# Patient Record
Sex: Female | Born: 1990 | Race: Black or African American | Hispanic: No | Marital: Single | Smoking: Current every day smoker
Health system: Southern US, Community
[De-identification: ages and names within clinical notes are randomized; demographics above are authoritative.]

---

## 2014-09-12 ENCOUNTER — Emergency Department (HOSPITAL_COMMUNITY)
Admission: EM | Admit: 2014-09-12 | Discharge: 2014-09-13 | Disposition: A | Discharge status: Home / Self Care | Payer: Self-pay | Attending: Dermatology | Admitting: Dermatology

## 2014-09-12 ENCOUNTER — Emergency Department (HOSPITAL_COMMUNITY): Payer: Self-pay

## 2014-09-12 ENCOUNTER — Encounter (HOSPITAL_COMMUNITY): Payer: Self-pay | Admitting: Emergency Medicine

## 2014-09-12 DIAGNOSIS — R42 Dizziness and giddiness: Secondary | ICD-10-CM | POA: Insufficient documentation

## 2014-09-12 DIAGNOSIS — R55 Syncope and collapse: Secondary | ICD-10-CM | POA: Insufficient documentation

## 2014-09-12 DIAGNOSIS — S3991XA Unspecified injury of abdomen, initial encounter: Secondary | ICD-10-CM | POA: Insufficient documentation

## 2014-09-12 DIAGNOSIS — Y990 Civilian activity done for income or pay: Secondary | ICD-10-CM | POA: Insufficient documentation

## 2014-09-12 DIAGNOSIS — Z720 Tobacco use: Secondary | ICD-10-CM | POA: Insufficient documentation

## 2014-09-12 DIAGNOSIS — R1110 Vomiting, unspecified: Secondary | ICD-10-CM | POA: Insufficient documentation

## 2014-09-12 DIAGNOSIS — Z3202 Encounter for pregnancy test, result negative: Secondary | ICD-10-CM | POA: Insufficient documentation

## 2014-09-12 DIAGNOSIS — R52 Pain, unspecified: Secondary | ICD-10-CM

## 2014-09-12 DIAGNOSIS — Y9389 Activity, other specified: Secondary | ICD-10-CM | POA: Insufficient documentation

## 2014-09-12 DIAGNOSIS — S8992XA Unspecified injury of left lower leg, initial encounter: Secondary | ICD-10-CM | POA: Insufficient documentation

## 2014-09-12 DIAGNOSIS — W1830XA Fall on same level, unspecified, initial encounter: Secondary | ICD-10-CM | POA: Insufficient documentation

## 2014-09-12 DIAGNOSIS — Y9289 Other specified places as the place of occurrence of the external cause: Secondary | ICD-10-CM | POA: Insufficient documentation

## 2014-09-12 DIAGNOSIS — R109 Unspecified abdominal pain: Secondary | ICD-10-CM

## 2014-09-12 DIAGNOSIS — S99922A Unspecified injury of left foot, initial encounter: Secondary | ICD-10-CM | POA: Insufficient documentation

## 2014-09-12 LAB — COMPREHENSIVE METABOLIC PANEL
ALK PHOS: 40 U/L (ref 39–117)
ALT: 9 U/L (ref 0–35)
AST: 16 U/L (ref 0–37)
Albumin: 2.8 g/dL — ABNORMAL LOW (ref 3.5–5.2)
Anion gap: 12 (ref 5–15)
BUN: 11 mg/dL (ref 6–23)
CALCIUM: 8.2 mg/dL — AB (ref 8.4–10.5)
CO2: 23 mEq/L (ref 19–32)
Chloride: 107 mEq/L (ref 96–112)
Creatinine, Ser: 0.91 mg/dL (ref 0.50–1.10)
GFR calc Af Amer: >90 mL/min (ref >90)
GFR, EST NON AFRICAN AMERICAN: 89 mL/min — AB (ref >90)
GLUCOSE: 115 mg/dL — AB (ref 70–99)
Potassium: 3.6 mEq/L — ABNORMAL LOW (ref 3.7–5.3)
SODIUM: 142 meq/L (ref 137–147)
Total Bilirubin: <0.2 mg/dL — ABNORMAL LOW (ref 0.3–1.2)
Total Protein: 5.5 g/dL — ABNORMAL LOW (ref 6.0–8.3)

## 2014-09-12 LAB — URINE MICROSCOPIC-ADD ON

## 2014-09-12 LAB — URINALYSIS, ROUTINE W REFLEX MICROSCOPIC
BILIRUBIN URINE: NEGATIVE
Glucose, UA: NEGATIVE mg/dL
Ketones, ur: NEGATIVE mg/dL
Leukocytes, UA: NEGATIVE
Nitrite: NEGATIVE
PROTEIN: NEGATIVE mg/dL
Specific Gravity, Urine: >1.03 — ABNORMAL HIGH (ref 1.005–1.030)
UROBILINOGEN UA: 0.2 mg/dL (ref 0.0–1.0)
pH: 5.5 (ref 5.0–8.0)

## 2014-09-12 LAB — CBC
HCT: 36 % (ref 36.0–46.0)
Hemoglobin: 12 g/dL (ref 12.0–15.0)
MCH: 28.1 pg (ref 26.0–34.0)
MCHC: 33.3 g/dL (ref 30.0–36.0)
MCV: 84.3 fL (ref 78.0–100.0)
PLATELETS: 210 10*3/uL (ref 150–400)
RBC: 4.27 MIL/uL (ref 3.87–5.11)
RDW: 13.8 % (ref 11.5–15.5)
WBC: 9.1 10*3/uL (ref 4.0–10.5)

## 2014-09-12 LAB — PREGNANCY, URINE: PREG TEST UR: NEGATIVE

## 2014-09-12 LAB — LIPASE, BLOOD: Lipase: 13 U/L (ref 11–59)

## 2014-09-12 MED ORDER — HYDROMORPHONE HCL 1 MG/ML IJ SOLN
1.0000 mg | Freq: Once | INTRAMUSCULAR | Status: AC
  Administered 2014-09-12: 1 mg via INTRAVENOUS
  Filled 2014-09-12: qty 1

## 2014-09-12 MED ORDER — ONDANSETRON HCL 4 MG/2ML IJ SOLN
4.0000 mg | Freq: Once | INTRAMUSCULAR | Status: AC
  Administered 2014-09-12: 4 mg via INTRAVENOUS
  Filled 2014-09-12: qty 2

## 2014-09-12 MED ORDER — SODIUM CHLORIDE 0.9 % IV BOLUS (SEPSIS)
1000.0000 mL | Freq: Once | INTRAVENOUS | Status: AC
  Administered 2014-09-12: 1000 mL via INTRAVENOUS

## 2014-09-12 MED ORDER — HYDROCODONE-ACETAMINOPHEN 5-325 MG PO TABS
2.0000 | ORAL_TABLET | Freq: Once | ORAL | Status: AC
  Administered 2014-09-12: 2 via ORAL
  Filled 2014-09-12: qty 2

## 2014-09-12 MED ORDER — SODIUM CHLORIDE 0.9 % IV BOLUS (SEPSIS)
1000.0000 mL | Freq: Once | INTRAVENOUS | Status: AC
Start: 2014-09-12 — End: 2014-09-13
  Administered 2014-09-12: 1000 mL via INTRAVENOUS

## 2014-09-12 NOTE — ED Provider Notes (Addendum)
CSN: [id number]     Arrival date & time 09/12/14  2034 History  This chart was scribed for Suzi Roots, MD by Tonye Royalty, ED Scribe. This patient was seen in room APA19/[facility]19 and the patient's care was started at 8:55 PM.    Chief Complaint  Patient presents with  . Weakness   The history is provided by the patient. No language interpreter was used.    HPI Comments: Stacey Crawford is a 23 y.o. female who presents to the Emergency Department complaining of syncope tonight. She states she was drawing blood at work at [employer] when she felt mid to upper abd pain, then felt hot and dizzy, then felt generalized weakness, then felt herself going down. She states she lost consciousness and woke on the floor. She notes pain to her left lower leg and great toe sustained on falling; she also complains of associated abdominal pain on waking from syncopal episode with 1 episode vomiting. Emesis not bloody or bilious.  Denies hx similar abd pain. No radiation or back or flank pain. No hx pancreatitis, pud, or gallstones.  No fam hx gallstones. denies any lower abd or pelvic pain. She notes that she donated plasma earlier today, but states she has donated before without problems. Besides donating plasma, she denies bleeding from blood in stool, melena/black stools,  or heavy period. She states she is having regular periods, the last of which was on 11/12. She states she has held her job for some time and states blood does not make her squeamish. Pt indicates is hungry, and had not eaten since lunch time. She denies any new medications. She notes history of migraines. She denies fever, cough, congestion, or appetite change.      History reviewed. No pertinent past medical history. History reviewed. No pertinent past surgical history. History reviewed. No pertinent family history. History  Substance Use Topics  . Smoking status: Current Every Day Smoker    Types: Cigarettes  . Smokeless tobacco:  Not on file  . Alcohol Use: Yes   OB History    No data available     Review of Systems  Constitutional: Negative for fever and chills.  HENT: Negative for congestion and sore throat.   Eyes: Negative for pain and visual disturbance.  Respiratory: Negative for cough and shortness of breath.   Cardiovascular: Negative for chest pain, palpitations and leg swelling.  Gastrointestinal: Positive for vomiting and abdominal pain. Negative for diarrhea, constipation and abdominal distention.  Genitourinary: Negative for dysuria, vaginal bleeding, vaginal discharge and pelvic pain.  Musculoskeletal: Negative for neck pain and neck stiffness.  Skin: Negative for wound.  Neurological: Positive for dizziness, syncope and weakness. Negative for numbness.  Psychiatric/Behavioral: Negative for confusion.  All other systems reviewed and are negative.     Allergies  Review of patient's allergies indicates no known allergies.  Home Medications   Prior to Admission medications   Medication Sig Start Date End Date Taking? Authorizing Provider  ibuprofen (ADVIL,MOTRIN) 200 MG tablet Take 200 mg by mouth every 6 (six) hours as needed for mild pain or moderate pain.   Yes Historical Provider, MD   BP 91/64 mmHg  Pulse 81  Temp(Src) 98.4 F (36.9 C) (Oral)  Resp 15  Ht 5\' 2"  (1.575 m)  Wt 230 lb (104.327 kg)  BMI 42.06 kg/m2  SpO2 99%  LMP 08/14/2014 Physical Exam  Constitutional: She is oriented to person, place, and time. She appears well-developed and well-nourished. No distress.  HENT:  Head: Normocephalic and atraumatic.  No facial or scalp sts or tenderness  Eyes: Conjunctivae and EOM are normal. Pupils are equal, round, and reactive to light. No scleral icterus.  Neck: Normal range of motion. Neck supple. No thyromegaly present.  No stiffness or rigidity. Normal rom.   Cardiovascular: Normal rate, regular rhythm, normal heart sounds and intact distal pulses.  Exam reveals no gallop  and no friction rub.   No murmur heard. Pulmonary/Chest: Effort normal and breath sounds normal. No respiratory distress. She has no wheezes. She has no rales. She exhibits no tenderness.  Abdominal: Soft. Bowel sounds are normal. She exhibits no distension and no mass. There is tenderness (epigastric). There is no rebound and no guarding.  Moderate mid abd/periumbilcal and upper abdomen pain/tenderness, more pronounced on right. No pelvic tenderness.   Genitourinary:  No cva tenderness  Musculoskeletal: Normal range of motion. She exhibits no edema or tenderness.  Tenderness to left mid tibia and left great toe. No deformity or signif sts noted. Good rom and knee and ankle without pain or focal bony tenderness.  CTLS spine, non tender, aligned, no step off.   Neurological: She is alert and oriented to person, place, and time. No cranial nerve deficit.  Motor intact bil. stre 5/5. sens intact.   Skin: Skin is warm and dry. She is not diaphoretic.  Psychiatric: She has a normal mood and affect.  Nursing note and vitals reviewed.   ED Course  Procedures (including critical care time)  DIAGNOSTIC STUDIES: Oxygen Saturation is 99% on room air, normal by my interpretation.    COORDINATION OF CARE: 9:02 PM Discussed treatment plan with patient at beside, the patient agrees with the plan and has no further questions at this time.   Results for orders placed or performed during the hospital encounter of 09/12/14  Comprehensive metabolic panel  Result Value Ref Range   Sodium 142 137 - 147 mEq/L   Potassium 3.6 (L) 3.7 - 5.3 mEq/L   Chloride 107 96 - 112 mEq/L   CO2 23 19 - 32 mEq/L   Glucose, Bld 115 (H) 70 - 99 mg/dL   BUN 11 6 - 23 mg/dL   Creatinine, Ser 0.450.91 0.50 - 1.10 mg/dL   Calcium 8.2 (L) 8.4 - 10.5 mg/dL   Total Protein 5.5 (L) 6.0 - 8.3 g/dL   Albumin 2.8 (L) 3.5 - 5.2 g/dL   AST 16 0 - 37 U/L   ALT 9 0 - 35 U/L   Alkaline Phosphatase 40 39 - 117 U/L   Total Bilirubin  <0.2 (L) 0.3 - 1.2 mg/dL   GFR calc non Af Amer 89 (L) >90 mL/min   GFR calc Af Amer >90 >90 mL/min   Anion gap 12 5 - 15  CBC  Result Value Ref Range   WBC 9.1 4.0 - 10.5 K/uL   RBC 4.27 3.87 - 5.11 MIL/uL   Hemoglobin 12.0 12.0 - 15.0 g/dL   HCT 40.936.0 81.136.0 - 91.446.0 %   MCV 84.3 78.0 - 100.0 fL   MCH 28.1 26.0 - 34.0 pg   MCHC 33.3 30.0 - 36.0 g/dL   RDW 78.213.8 95.611.5 - 21.315.5 %   Platelets 210 150 - 400 K/uL  Pregnancy, urine  Result Value Ref Range   Preg Test, Ur NEGATIVE NEGATIVE  Lipase, blood  Result Value Ref Range   Lipase 13 11 - 59 U/L  Urinalysis, Routine w reflex microscopic  Result Value Ref Range  Color, Urine YELLOW YELLOW   APPearance HAZY (A) CLEAR   Specific Gravity, Urine >1.030 (H) 1.005 - 1.030   pH 5.5 5.0 - 8.0   Glucose, UA NEGATIVE NEGATIVE mg/dL   Hgb urine dipstick LARGE (A) NEGATIVE   Bilirubin Urine NEGATIVE NEGATIVE   Ketones, ur NEGATIVE NEGATIVE mg/dL   Protein, ur NEGATIVE NEGATIVE mg/dL   Urobilinogen, UA 0.2 0.0 - 1.0 mg/dL   Nitrite NEGATIVE NEGATIVE   Leukocytes, UA NEGATIVE NEGATIVE  Urine microscopic-add on  Result Value Ref Range   Squamous Epithelial / LPF MANY (A) RARE   RBC / HPF 11-20 <3 RBC/hpf   Bacteria, UA FEW (A) RARE   Dg Tibia/fibula Left  09/12/2014   CLINICAL DATA:  Syncope at work, loss of consciousness, woke up on floor, leg and foot pain.  EXAM: LEFT TIBIA AND FIBULA - 2 VIEW  COMPARISON:  None.  FINDINGS: There is no evidence of fracture or other focal bone lesions. Soft tissues are unremarkable.  IMPRESSION: Negative.   Electronically Signed   By: Awilda Metroourtnay  Bloomer   On: 09/12/2014 21:26   Dg Toe Great Left  09/12/2014   CLINICAL DATA:  Syncope at work, loss of consciousness, woke up on floor, leg and foot pain.  EXAM: LEFT GREAT TOE  COMPARISON:  None.  FINDINGS: There is no evidence of fracture or dislocation. There is no evidence of arthropathy or other focal bone abnormality. Soft tissues are unremarkable.   IMPRESSION: Negative.   Electronically Signed   By: Awilda Metroourtnay  Bloomer   On: 09/12/2014 21:27       MDM   I personally performed the services described in this documentation, which was scribed in my presence. The recorded information has been reviewed and considered. Suzi RootsKevin E Osie Amparo, MD  Xrays. Iv ns bolus. Dilaudid 1 mg iv for pain. zofran iv.   Labs.  Reviewed nursing notes and prior charts for additional history.   Additional ivf.   Recheck spine nt.  Pt states wants food/drink, c/o not eating all evening.   On recheck, persistent mod abd tenderness, mid abd and right side, without peritoneal signs.   Recheck additional occasions, discussed labs.    As abd pain/tenderness persist, will get ct.   Signed out to Dr Preston FleetingGlick to check ct when back.  If ct neg and pt improved, feel will likely be stable for d/c.           Suzi RootsKevin E Amauris Debois, MD 09/13/14 [phone]

## 2014-09-12 NOTE — ED Notes (Signed)
Pt was at work and getting blood sample bent over having abd pain and fell to the floor per staff. Pt states she gave plasma today at 1530

## 2014-09-13 ENCOUNTER — Emergency Department (HOSPITAL_COMMUNITY): Payer: Self-pay

## 2014-09-13 MED ORDER — IOHEXOL 300 MG/ML  SOLN
100.0000 mL | Freq: Once | INTRAMUSCULAR | Status: AC | PRN
  Administered 2014-09-13: 100 mL via INTRAVENOUS

## 2014-09-13 MED ORDER — IOHEXOL 300 MG/ML  SOLN
50.0000 mL | Freq: Once | INTRAMUSCULAR | Status: AC | PRN
  Administered 2014-09-13: 50 mL via ORAL

## 2014-09-13 MED ORDER — OXYCODONE-ACETAMINOPHEN 5-325 MG PO TABS: 1.0000 | ORAL_TABLET | ORAL | Status: AC | PRN

## 2014-09-13 NOTE — Discharge Instructions (Signed)
Abdominal Pain °Many things can cause abdominal pain. Usually, abdominal pain is not caused by a disease and will improve without treatment. It can often be observed and treated at home. Your health care provider will do a physical exam and possibly order blood tests and X-rays to help determine the seriousness of your pain. However, in many cases, more time must pass before a clear cause of the pain can be found. Before that point, your health care provider may not know if you need more testing or further treatment. °HOME CARE INSTRUCTIONS  °Monitor your abdominal pain for any changes. The following actions may help to alleviate any discomfort you are experiencing: °· Only take over-the-counter or prescription medicines as directed by your health care provider. °· Do not take laxatives unless directed to do so by your health care provider. °· Try a clear liquid diet (broth, tea, or water) as directed by your health care provider. Slowly move to a bland diet as tolerated. °SEEK MEDICAL CARE IF: °· You have unexplained abdominal pain. °· You have abdominal pain associated with nausea or diarrhea. °· You have pain when you urinate or have a bowel movement. °· You experience abdominal pain that wakes you in the night. °· You have abdominal pain that is worsened or improved by eating food. °· You have abdominal pain that is worsened with eating fatty foods. °· You have a fever. °SEEK IMMEDIATE MEDICAL CARE IF:  °· Your pain does not go away within 2 hours. °· You keep throwing up (vomiting). °· Your pain is felt only in portions of the abdomen, such as the right side or the left lower portion of the abdomen. °· You pass bloody or black tarry stools. °MAKE SURE YOU: °· Understand these instructions.   °· Will watch your condition.   °· Will get help right away if you are not doing well or get worse.   °Document Released: 06/29/2005 Document Revised: 09/24/2013 Document Reviewed: 05/29/2013 °ExitCare® Patient Information  ©2015 [employer]. This information is not intended to replace advice given to you by your health care provider. Make sure you discuss any questions you have with your health care provider. ° °Syncope °Syncope is a medical term for fainting or passing out. This means you lose consciousness and drop to the ground. People are generally unconscious for less than 5 minutes. You may have some muscle twitches for up to 15 seconds before waking up and returning to normal. Syncope occurs more often in older adults, but it can happen to anyone. While most causes of syncope are not dangerous, syncope can be a sign of a serious medical problem. It is important to seek medical care.  °CAUSES  °Syncope is caused by a sudden drop in blood flow to the brain. The specific cause is often not determined. Factors that can bring on syncope include: °· Taking medicines that lower blood pressure. °· Sudden changes in posture, such as standing up quickly. °· Taking more medicine than prescribed. °· Standing in one place for too long. °· Seizure disorders. °· Dehydration and excessive exposure to heat. °· Low blood sugar (hypoglycemia). °· Straining to have a bowel movement. °· Heart disease, irregular heartbeat, or other circulatory problems. °· Fear, emotional distress, seeing blood, or severe pain. °SYMPTOMS  °Right before fainting, you may: °· Feel dizzy or light-headed. °· Feel nauseous. °· See all white or all black in your field of vision. °· Have cold, clammy skin. °DIAGNOSIS  °Your health care provider will ask about your   symptoms, perform a physical exam, and perform an electrocardiogram (ECG) to record the electrical activity of your heart. Your health care provider may also perform other heart or blood tests to determine the cause of your syncope which may include:  Transthoracic echocardiogram (TTE). During echocardiography, sound waves are used to evaluate how blood flows through your heart.  Transesophageal  echocardiogram (TEE).  Cardiac monitoring. This allows your health care provider to monitor your heart rate and rhythm in real time.  Holter monitor. This is a portable device that records your heartbeat and can help diagnose heart arrhythmias. It allows your health care provider to track your heart activity for several days, if needed.  Stress tests by exercise or by giving medicine that makes the heart beat faster. TREATMENT  In most cases, no treatment is needed. Depending on the cause of your syncope, your health care provider may recommend changing or stopping some of your medicines. HOME CARE INSTRUCTIONS  Have someone stay with you until you feel stable.  Do not drive, use machinery, or play sports until your health care provider says it is okay.  Keep all follow-up appointments as directed by your health care provider.  Lie down right away if you start feeling like you might faint. Breathe deeply and steadily. Wait until all the symptoms have passed.  Drink enough fluids to keep your urine clear or pale yellow.  If you are taking blood pressure or heart medicine, get up slowly and take several minutes to sit and then stand. This can reduce dizziness. SEEK IMMEDIATE MEDICAL CARE IF:   You have a severe headache.  You have unusual pain in the chest, abdomen, or back.  You are bleeding from your mouth or rectum, or you have black or tarry stool.  You have an irregular or very fast heartbeat.  You have pain with breathing.  You have repeated fainting or seizure-like jerking during an episode.  You faint when sitting or lying down.  You have confusion.  You have trouble walking.  You have severe weakness.  You have vision problems. If you fainted, call your local emergency services (911 in [place] Do not drive yourself to the hospital.  MAKE SURE YOU:  Understand these instructions.  Will watch your condition.  Will get help right away if you are not doing well  or get worse. Document Released: 09/19/2005 Document Revised: 09/24/2013 Document Reviewed: 11/18/2011 [facility] Patient Information 2015 [employer], [place]. This information is not intended to replace advice given to you by your health care provider. Make sure you discuss any questions you have with your health care provider.  Acetaminophen; Oxycodone tablets What is this medicine? ACETAMINOPHEN; OXYCODONE (a set a MEE noe fen; ox i KOE done) is a pain reliever. It is used to treat mild to moderate pain. This medicine may be used for other purposes; ask your health care provider or pharmacist if you have questions. COMMON BRAND NAME(S): Endocet, Magnacet, Narvox, Percocet, Perloxx, Primalev, Primlev, Roxicet, Xolox What should I tell my health care provider before I take this medicine? They need to know if you have any of these conditions: -brain tumor -Crohn's disease, inflammatory bowel disease, or ulcerative colitis -drug abuse or addiction -head injury -heart or circulation problems -if you often drink alcohol -kidney disease or problems going to the bathroom -liver disease -lung disease, asthma, or breathing problems -an unusual or allergic reaction to acetaminophen, oxycodone, other opioid analgesics, other medicines, foods, dyes, or preservatives -pregnant or trying to get pregnant -  breast-feeding How should I use this medicine? Take this medicine by mouth with a full glass of water. Follow the directions on the prescription label. Take your medicine at regular intervals. Do not take your medicine more often than directed. Talk to your pediatrician regarding the use of this medicine in children. Special care may be needed. Patients over 80 years old may have a stronger reaction and need a smaller dose. Overdosage: If you think you have taken too much of this medicine contact a poison control center or emergency room at once. NOTE: This medicine is only for you. Do not share this  medicine with others. What if I miss a dose? If you miss a dose, take it as soon as you can. If it is almost time for your next dose, take only that dose. Do not take double or extra doses. What may interact with this medicine? -alcohol -antihistamines -barbiturates like amobarbital, butalbital, butabarbital, methohexital, pentobarbital, phenobarbital, thiopental, and secobarbital -benztropine -drugs for bladder problems like solifenacin, trospium, oxybutynin, tolterodine, hyoscyamine, and methscopolamine -drugs for breathing problems like ipratropium and tiotropium -drugs for certain stomach or intestine problems like propantheline, homatropine methylbromide, glycopyrrolate, atropine, belladonna, and dicyclomine -general anesthetics like etomidate, ketamine, nitrous oxide, propofol, desflurane, enflurane, halothane, isoflurane, and sevoflurane -medicines for depression, anxiety, or psychotic disturbances -medicines for sleep -muscle relaxants -naltrexone -narcotic medicines (opiates) for pain -phenothiazines like perphenazine, thioridazine, chlorpromazine, mesoridazine, fluphenazine, prochlorperazine, promazine, and trifluoperazine -scopolamine -tramadol -trihexyphenidyl This list may not describe all possible interactions. Give your health care provider a list of all the medicines, herbs, non-prescription drugs, or dietary supplements you use. Also tell them if you smoke, drink alcohol, or use illegal drugs. Some items may interact with your medicine. What should I watch for while using this medicine? Tell your doctor or health care professional if your pain does not go away, if it gets worse, or if you have new or a different type of pain. You may develop tolerance to the medicine. Tolerance means that you will need a higher dose of the medication for pain relief. Tolerance is normal and is expected if you take this medicine for a long time. Do not suddenly stop taking your medicine  because you may develop a severe reaction. Your body becomes used to the medicine. This does NOT mean you are addicted. Addiction is a behavior related to getting and using a drug for a non-medical reason. If you have pain, you have a medical reason to take pain medicine. Your doctor will tell you how much medicine to take. If your doctor wants you to stop the medicine, the dose will be slowly lowered over time to avoid any side effects. You may get drowsy or dizzy. Do not drive, use machinery, or do anything that needs mental alertness until you know how this medicine affects you. Do not stand or sit up quickly, especially if you are an older patient. This reduces the risk of dizzy or fainting spells. Alcohol may interfere with the effect of this medicine. Avoid alcoholic drinks. There are different types of narcotic medicines (opiates) for pain. If you take more than one type at the same time, you may have more side effects. Give your health care provider a list of all medicines you use. Your doctor will tell you how much medicine to take. Do not take more medicine than directed. Call emergency for help if you have problems breathing. The medicine will cause constipation. Try to have a bowel movement at least every 2  to 3 days. If you do not have a bowel movement for 3 days, call your doctor or health care professional. Do not take Tylenol (acetaminophen) or medicines that have acetaminophen with this medicine. Too much acetaminophen can be very dangerous. Many nonprescription medicines contain acetaminophen. Always read the labels carefully to avoid taking more acetaminophen. What side effects may I notice from receiving this medicine? Side effects that you should report to your doctor or health care professional as soon as possible: -allergic reactions like skin rash, itching or hives, swelling of the face, lips, or tongue -breathing difficulties, wheezing -confusion -light headedness or fainting  spells -severe stomach pain -unusually weak or tired -yellowing of the skin or the whites of the eyes Side effects that usually do not require medical attention (report to your doctor or health care professional if they continue or are bothersome): -dizziness -drowsiness -nausea -vomiting This list may not describe all possible side effects. Call your doctor for medical advice about side effects. You may report side effects to FDA at 1-800-FDA-1088. Where should I keep my medicine? Keep out of the reach of children. This medicine can be abused. Keep your medicine in a safe place to protect it from theft. Do not share this medicine with anyone. Selling or giving away this medicine is dangerous and against the law. Store at room temperature between 20 and 25 degrees C (68 and 77 degrees F). Keep container tightly closed. Protect from light. This medicine may cause accidental overdose and death if it is taken by other adults, children, or pets. Flush any unused medicine down the toilet to reduce the chance of harm. Do not use the medicine after the expiration date. NOTE: This sheet is a summary. It may not cover all possible information. If you have questions about this medicine, talk to your doctor, pharmacist, or health care provider.  2015, [employer]. (2013-05-13 13:17:35)

## 2014-09-13 NOTE — ED Notes (Signed)
Pt was given a pre-package of Percocet 5/325 mg quantity six and given instructions on use, pt verbally understands.

## 2014-09-13 NOTE — ED Provider Notes (Signed)
Patient initially seen and evaluated by Dr. Meredith ModyStein all, signed out to me to evaluate results of CT scan. CT is come back unremarkable. Patient is feeling much better but, on exam, she continues to have periumbilical tenderness. She is discharged with prescription for oxycodone and acetaminophen. She is given a work release for 2 days.  Dione Boozeavid Pietrina Jagodzinski, MD 09/13/14 [phone]

## 2014-09-29 MED FILL — Oxycodone w/ Acetaminophen Tab 5-325 MG: ORAL | Qty: 6 | Status: AC

## 2016-04-26 IMAGING — CT CT ABD-PELV W/ CM
2 of 4 series · 17 of 46 positions shown, 19 images · IV contrast (Omnipaque 300)
Comparison: None.

CLINICAL DATA: Patient passed out wall drawing blood work. Mid to
upper abdominal pain. Hot and dizzy with generalized weakness.

EXAM:
CT ABDOMEN AND PELVIS WITH CONTRAST
TECHNIQUE: Multidetector CT imaging of the abdomen and pelvis was performed
using the standard protocol following bolus administration of
intravenous contrast.
CONTRAST:  50mL OMNIPAQUE IOHEXOL 300 MG/ML SOLN, 100mL OMNIPAQUE
IOHEXOL 300 MG/ML SOLN

[Series 2: abd_pel_with 5.0 b40f · axial · 0.78mm/px · z∈[+646,+1060]mm · 14 of 91 slices shown, 16 images]
[im 4/91  soft-tissue]
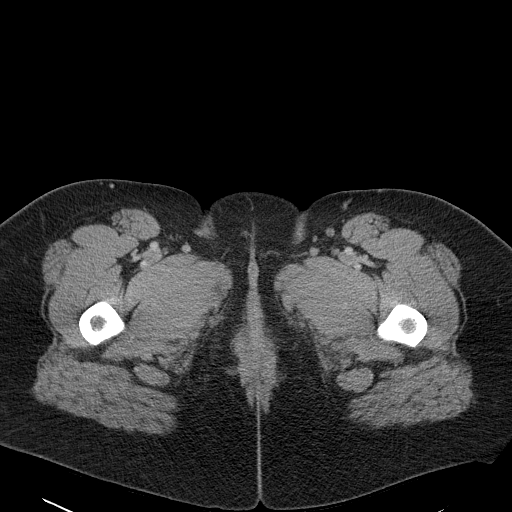
[im 4/91  bone]
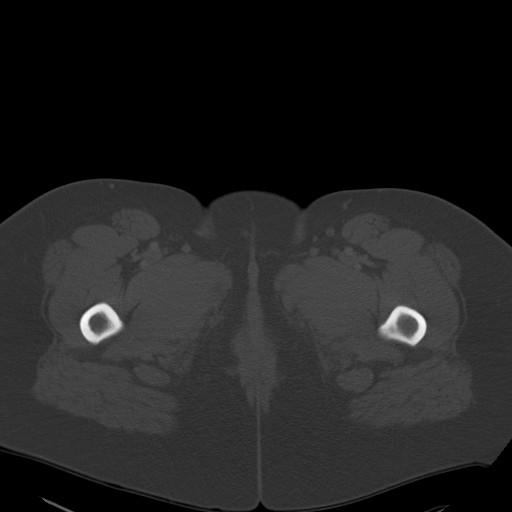
[im 12/91  soft-tissue]
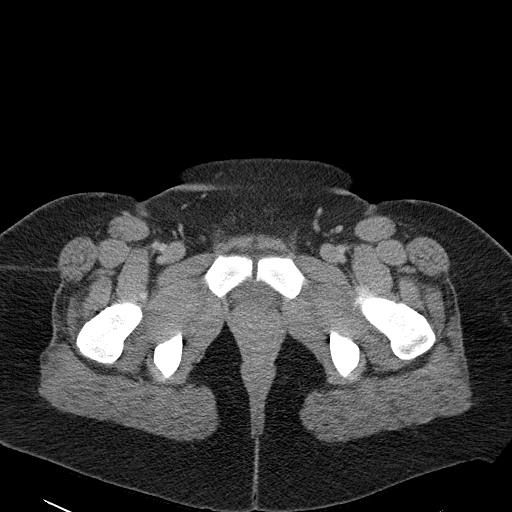
[im 19/91  soft-tissue]
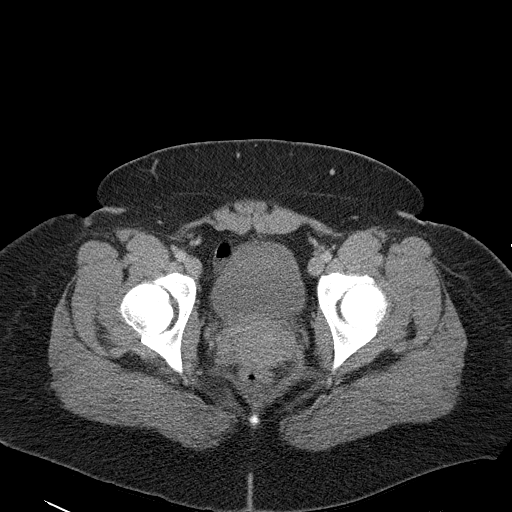
[im 23/91  soft-tissue]
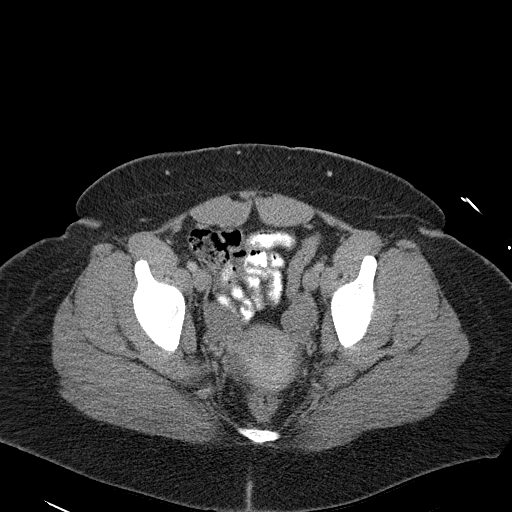
[im 31/91  soft-tissue]
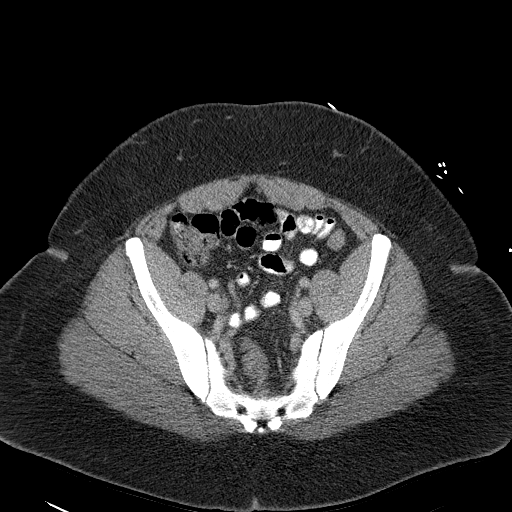
[im 38/91  soft-tissue]
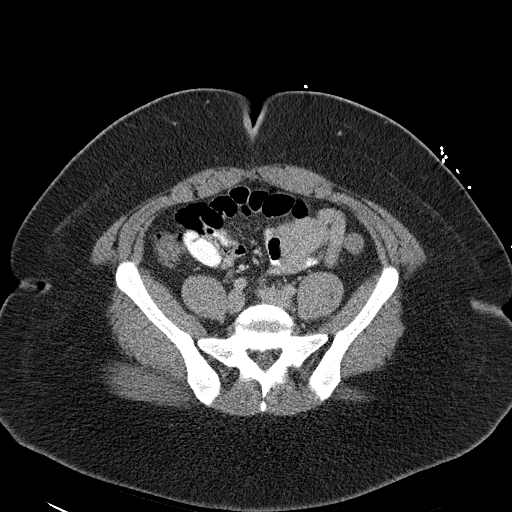
[im 42/91  soft-tissue]
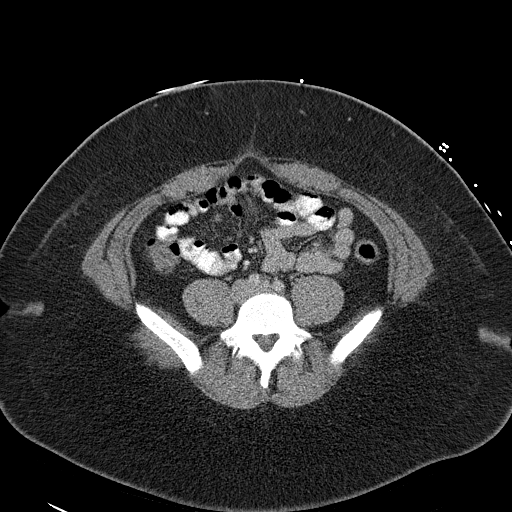
[im 49/91  soft-tissue]
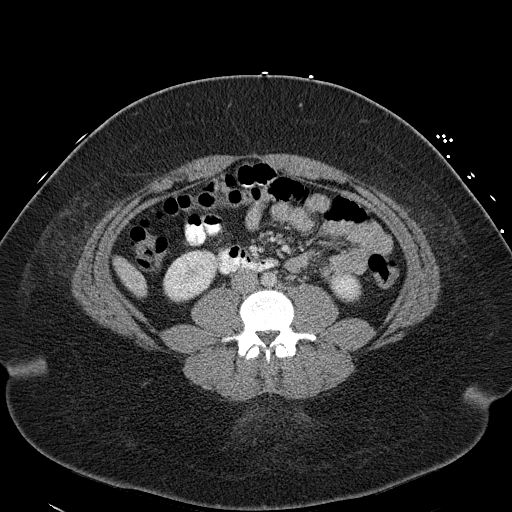
[im 53/91  soft-tissue]
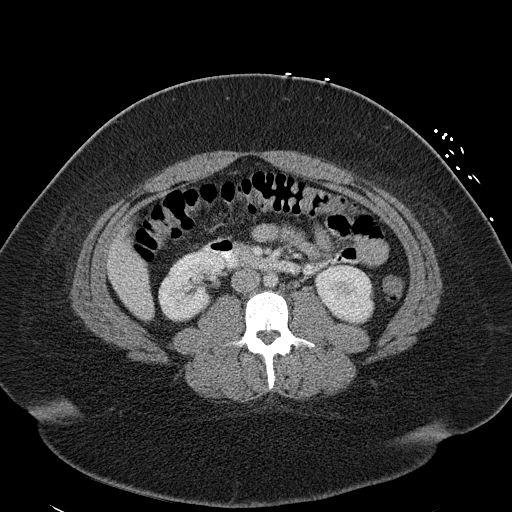
[im 53/91  bone]
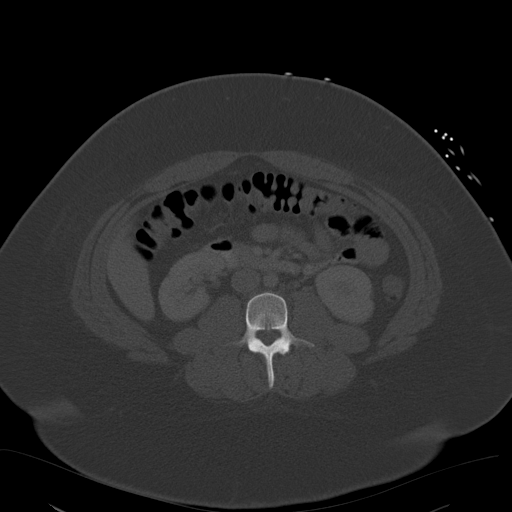
[im 61/91  soft-tissue]
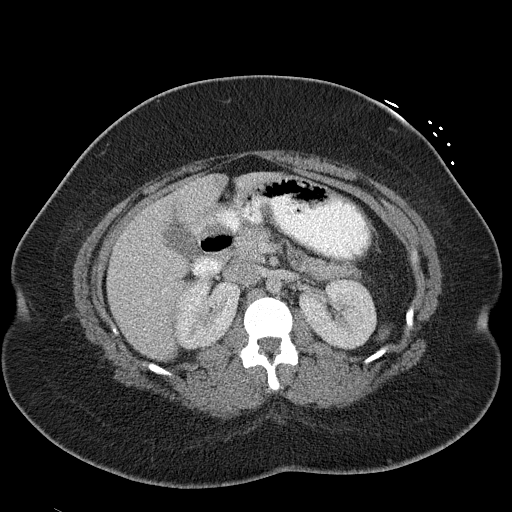
[im 68/91  soft-tissue]
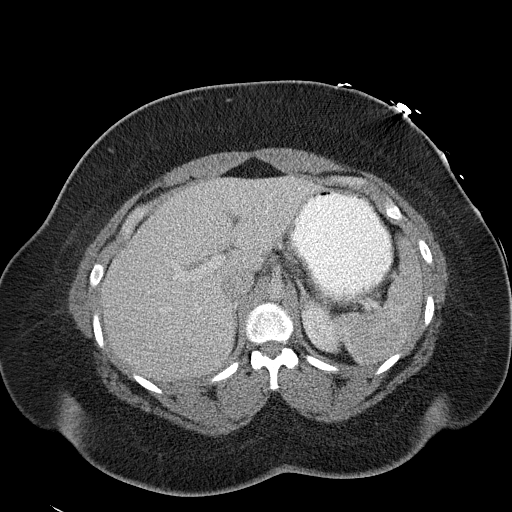
[im 72/91  soft-tissue]
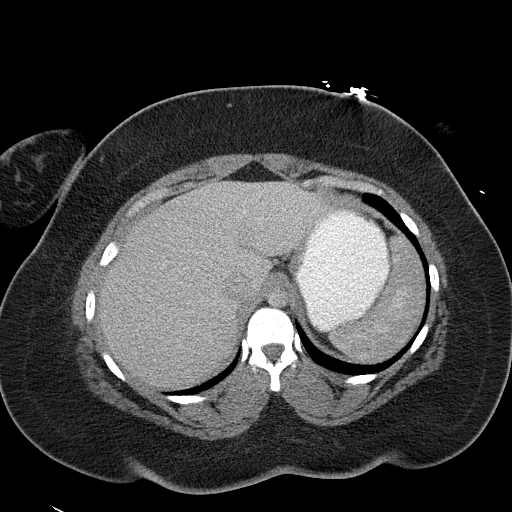
[im 79/91  soft-tissue]
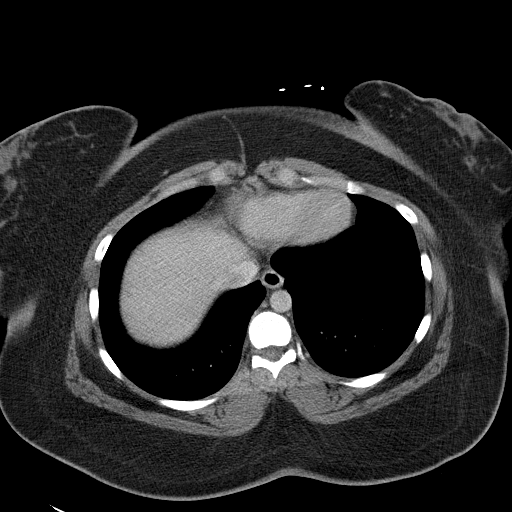
[im 87/91  soft-tissue]
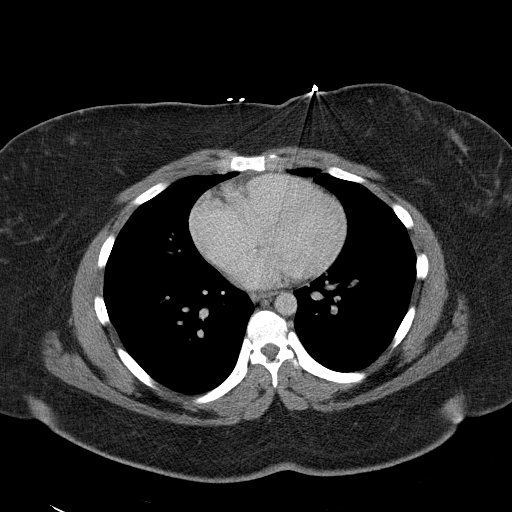

[Series 3: abd_pel_with 3.0 spo cor · coronal · 0.90mm/px · 3 of 115 slices shown]
[im 39/115  soft-tissue]
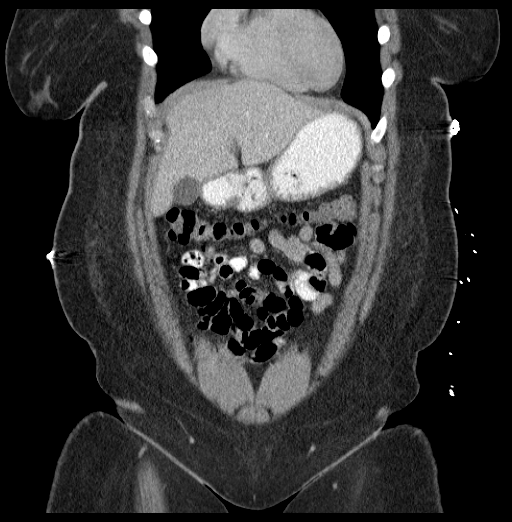
[im 51/115  soft-tissue]
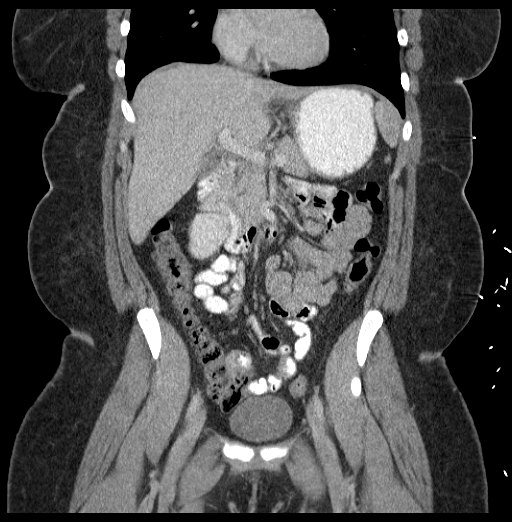
[im 64/115  soft-tissue]
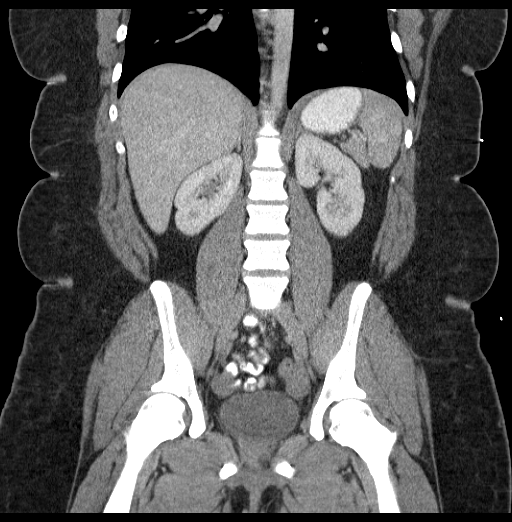

[17 of 46 positions shown; findings below may reference images not displayed]

FINDINGS: Lung bases are clear.

The liver, spleen, gallbladder, pancreas, adrenal glands, kidneys,
abdominal aorta, inferior vena cava, and retroperitoneal lymph nodes
are unremarkable. Stomach and small bowel are normal. Scattered
stool in the colon without distention or wall thickening. No free
air or free fluid in the abdomen.

Pelvis: Uterus and ovaries are not enlarged. No free or loculated
pelvic fluid collections. No pelvic mass or lymphadenopathy. Bladder
wall is not thickened. Appendix is normal. No destructive bone
lesions.
IMPRESSION: Normal examination.
